# Patient Record
Sex: Female | Born: 1950 | Race: White | Hispanic: No | Smoking: Former smoker
Health system: Southern US, Community
[De-identification: ages and names within clinical notes are randomized; demographics above are authoritative.]

## PROBLEM LIST (undated history)

## (undated) DIAGNOSIS — I10 Essential (primary) hypertension: Secondary | ICD-10-CM

## (undated) DIAGNOSIS — E7800 Pure hypercholesterolemia, unspecified: Secondary | ICD-10-CM

## (undated) DIAGNOSIS — N182 Chronic kidney disease, stage 2 (mild): Secondary | ICD-10-CM

## (undated) DIAGNOSIS — G8929 Other chronic pain: Secondary | ICD-10-CM

## (undated) DIAGNOSIS — H9190 Unspecified hearing loss, unspecified ear: Secondary | ICD-10-CM

## (undated) DIAGNOSIS — M6281 Muscle weakness (generalized): Secondary | ICD-10-CM

## (undated) DIAGNOSIS — E119 Type 2 diabetes mellitus without complications: Secondary | ICD-10-CM

## (undated) DIAGNOSIS — G4700 Insomnia, unspecified: Secondary | ICD-10-CM

## (undated) DIAGNOSIS — F319 Bipolar disorder, unspecified: Secondary | ICD-10-CM

## (undated) DIAGNOSIS — E039 Hypothyroidism, unspecified: Secondary | ICD-10-CM

## (undated) DIAGNOSIS — R269 Unspecified abnormalities of gait and mobility: Secondary | ICD-10-CM

## (undated) DIAGNOSIS — H269 Unspecified cataract: Secondary | ICD-10-CM

## (undated) DIAGNOSIS — M810 Age-related osteoporosis without current pathological fracture: Secondary | ICD-10-CM

## (undated) DIAGNOSIS — I619 Nontraumatic intracerebral hemorrhage, unspecified: Secondary | ICD-10-CM

## (undated) HISTORY — DX: Type 2 diabetes mellitus without complications: E11.9

## (undated) HISTORY — DX: Pure hypercholesterolemia, unspecified: E78.00

## (undated) HISTORY — DX: Age-related osteoporosis without current pathological fracture: M81.0

## (undated) HISTORY — DX: Nontraumatic intracerebral hemorrhage, unspecified: I61.9

## (undated) HISTORY — DX: Hypothyroidism, unspecified: E03.9

## (undated) HISTORY — DX: Essential (primary) hypertension: I10

## (undated) HISTORY — DX: Unspecified abnormalities of gait and mobility: R26.9

## (undated) HISTORY — DX: Insomnia, unspecified: G47.00

## (undated) HISTORY — DX: Other chronic pain: G89.29

## (undated) HISTORY — DX: Unspecified cataract: H26.9

## (undated) HISTORY — DX: Muscle weakness (generalized): M62.81

## (undated) HISTORY — PX: CATARACT EXTRACTION: SUR2

## (undated) HISTORY — DX: Bipolar disorder, unspecified: F31.9

## (undated) HISTORY — DX: Chronic kidney disease, stage 2 (mild): N18.2

---

## 1997-12-26 ENCOUNTER — Other Ambulatory Visit: Admission: RE | Admit: 1997-12-26 | Discharge: 1997-12-26 | Payer: Self-pay | Admitting: Obstetrics and Gynecology

## 2001-02-25 ENCOUNTER — Other Ambulatory Visit: Admission: RE | Admit: 2001-02-25 | Discharge: 2001-02-25 | Payer: Self-pay | Admitting: Obstetrics and Gynecology

## 2002-03-25 ENCOUNTER — Other Ambulatory Visit: Admission: RE | Admit: 2002-03-25 | Discharge: 2002-03-25 | Payer: Self-pay | Admitting: Obstetrics and Gynecology

## 2002-04-19 ENCOUNTER — Encounter: Admission: RE | Admit: 2002-04-19 | Discharge: 2002-04-19 | Payer: Self-pay | Admitting: Urology

## 2002-04-19 ENCOUNTER — Encounter: Payer: Self-pay | Admitting: Urology

## 2002-12-26 ENCOUNTER — Other Ambulatory Visit: Admission: RE | Admit: 2002-12-26 | Discharge: 2002-12-26 | Payer: Self-pay | Admitting: Obstetrics and Gynecology

## 2004-01-23 ENCOUNTER — Other Ambulatory Visit: Admission: RE | Admit: 2004-01-23 | Discharge: 2004-01-23 | Payer: Self-pay | Admitting: Obstetrics and Gynecology

## 2004-06-10 ENCOUNTER — Ambulatory Visit (HOSPITAL_COMMUNITY): Admission: RE | Admit: 2004-06-10 | Discharge: 2004-06-10 | Payer: Self-pay | Admitting: Urology

## 2005-02-26 ENCOUNTER — Other Ambulatory Visit: Admission: RE | Admit: 2005-02-26 | Discharge: 2005-02-26 | Payer: Self-pay | Admitting: Obstetrics and Gynecology

## 2006-04-21 ENCOUNTER — Encounter: Admission: RE | Admit: 2006-04-21 | Discharge: 2006-04-21 | Payer: Self-pay | Admitting: Obstetrics and Gynecology

## 2007-11-04 ENCOUNTER — Ambulatory Visit (HOSPITAL_COMMUNITY): Admission: RE | Admit: 2007-11-04 | Discharge: 2007-11-04 | Payer: Self-pay | Admitting: Urology

## 2009-05-22 ENCOUNTER — Ambulatory Visit: Payer: Self-pay | Admitting: Internal Medicine

## 2009-07-02 ENCOUNTER — Ambulatory Visit: Payer: Self-pay | Admitting: Internal Medicine

## 2009-07-05 ENCOUNTER — Encounter: Payer: Self-pay | Admitting: Internal Medicine

## 2009-07-09 ENCOUNTER — Telehealth (INDEPENDENT_AMBULATORY_CARE_PROVIDER_SITE_OTHER): Payer: Self-pay | Admitting: *Deleted

## 2012-01-06 ENCOUNTER — Emergency Department (HOSPITAL_BASED_OUTPATIENT_CLINIC_OR_DEPARTMENT_OTHER)
Admission: EM | Admit: 2012-01-06 | Discharge: 2012-01-06 | Disposition: A | Discharge status: Home / Self Care | Payer: Federal, State, Local not specified - PPO | Attending: Emergency Medicine | Admitting: Emergency Medicine

## 2012-01-06 ENCOUNTER — Encounter (HOSPITAL_BASED_OUTPATIENT_CLINIC_OR_DEPARTMENT_OTHER): Payer: Self-pay | Admitting: *Deleted

## 2012-01-06 DIAGNOSIS — T169XXA Foreign body in ear, unspecified ear, initial encounter: Secondary | ICD-10-CM | POA: Insufficient documentation

## 2012-01-06 DIAGNOSIS — Z87891 Personal history of nicotine dependence: Secondary | ICD-10-CM | POA: Insufficient documentation

## 2012-01-06 DIAGNOSIS — IMO0002 Reserved for concepts with insufficient information to code with codable children: Secondary | ICD-10-CM | POA: Insufficient documentation

## 2012-01-06 HISTORY — DX: Unspecified hearing loss, unspecified ear: H91.90

## 2012-01-06 NOTE — ED Notes (Signed)
Pt c/o piece of hearing aid in left ear, sent here from [facility]

## 2012-01-06 NOTE — Discharge Instructions (Signed)
Ear Foreign Body  An ear foreign body is an object that is stuck in the ear. It is common for young children to put objects into the ear canal. These may include pebbles, beads, beans, and any other small objects which will fit. In adults, objects such as cotton swabs may become lodged in the ear canal. In all ages, the most common foreign bodies are insects that enter the ear canal.   SYMPTOMS   Foreign bodies may cause pain, buzzing or roaring sounds, hearing loss, and ear drainage.   HOME CARE INSTRUCTIONS    Keep all follow-up appointments with your caregiver as told.   Keep small objects out of reach of young children. Tell them not to put anything in their ears.  SEEK IMMEDIATE MEDICAL CARE IF:    You have bleeding from the ear.   You have increased pain or swelling of the ear.   You have reduced hearing.   You have discharge coming from the ear.   You have a fever.   You have a headache.  MAKE SURE YOU:    Understand these instructions.   Will watch your condition.   Will get help right away if you are not doing well or get worse.  Document Released: 07/18/2000 Document Revised: 07/10/2011 Document Reviewed: 03/08/2008  ExitCare Patient Information 2012 [employer].

## 2012-01-06 NOTE — ED Provider Notes (Signed)
History     CSN: [id number]  Arrival date & time 01/06/12  1419   First MD Initiated Contact with Patient 01/06/12 1441      Chief Complaint  Patient presents with  . Foreign Body in Ear    (Consider location/radiation/quality/duration/timing/severity/associated sxs/prior treatment) HPI Comments: Pt states that she has part of her hearing aid stuck in her left ear  Patient is a 61 y.o. female presenting with foreign body in ear. The history is provided by the patient. No language interpreter was used.  Foreign Body in Ear This is a new problem. The current episode started yesterday. The problem occurs constantly. The problem has been unchanged.    Past Medical History  Diagnosis Date  . HOH (hard of hearing)     History reviewed. No pertinent past surgical history.  History reviewed. No pertinent family history.  History  Substance Use Topics  . Smoking status: Former Games developer  . Smokeless tobacco: Not on file  . Alcohol Use: No    OB History    Grav Para Term Preterm Abortions TAB SAB Ect Mult Living                  Review of Systems  Constitutional: Negative.   Respiratory: Negative.   Cardiovascular: Negative.   Neurological: Negative.     Allergies  Propoxyphene hcl and Propoxyphene-acetaminophen  Home Medications  No current outpatient prescriptions on file.  BP 137/72  Pulse 71  Temp(Src) 98.8 F (37.1 C) (Oral)  Resp 18  Ht 5\' 4"  (1.626 m)  Wt 164 lb (74.39 kg)  BMI 28.15 kg/m2  SpO2 96%  Physical Exam  Vitals reviewed. Constitutional: She appears well-developed and well-nourished.  HENT:       Pt has a plastic ear piece in the left ear canal  Cardiovascular: Normal rate and regular rhythm.   Pulmonary/Chest: Effort normal and breath sounds normal.    ED Course  FOREIGN BODY REMOVAL Performed by: Teressa Lower Authorized by: Teressa Lower Consent: Verbal consent obtained. Written consent not obtained. Consent given by:  patient Patient identity confirmed: verbally with patient Time out: Immediately prior to procedure a "time out" was called to verify the correct patient, procedure, equipment, support staff and site/side marked as required. Body area: ear Location details: left ear Localization method: visualized Removal mechanism: forceps 1 objects recovered. Objects recovered: part of hearing aid Post-procedure assessment: foreign body removed Patient tolerance: Patient tolerated the procedure well with no immediate complications.   (including critical care time)  Labs Reviewed - No data to display No results found.   1. FB ear       MDM  fb removed:tm intact        Teressa Lower, NP 01/06/12 1459

## 2012-01-12 NOTE — ED Provider Notes (Signed)
Medical screening examination/treatment/procedure(s) were performed by non-physician practitioner and as supervising physician I was immediately available for consultation/collaboration.    Caren Garske L Rozlyn Yerby, MD 01/12/12 1133 

## 2014-11-20 ENCOUNTER — Other Ambulatory Visit: Payer: Self-pay | Admitting: Obstetrics and Gynecology

## 2014-11-21 LAB — CYTOLOGY - PAP

## 2015-03-08 ENCOUNTER — Encounter: Payer: Self-pay | Admitting: Internal Medicine

## 2016-12-22 ENCOUNTER — Other Ambulatory Visit: Payer: Self-pay | Admitting: Obstetrics and Gynecology

## 2016-12-22 DIAGNOSIS — R928 Other abnormal and inconclusive findings on diagnostic imaging of breast: Secondary | ICD-10-CM

## 2016-12-24 ENCOUNTER — Other Ambulatory Visit: Payer: Self-pay | Admitting: Obstetrics and Gynecology

## 2016-12-24 ENCOUNTER — Ambulatory Visit
Admission: RE | Admit: 2016-12-24 | Discharge: 2016-12-24 | Disposition: A | Discharge status: Home / Self Care | Payer: Federal, State, Local not specified - PPO | Source: Ambulatory Visit | Attending: Obstetrics and Gynecology | Admitting: Obstetrics and Gynecology

## 2016-12-24 DIAGNOSIS — R928 Other abnormal and inconclusive findings on diagnostic imaging of breast: Secondary | ICD-10-CM

## 2018-02-01 IMAGING — MG DIGITAL DIAGNOSTIC UNILATERAL RIGHT MAMMOGRAM
3 series · 3 of 3 positions shown · non-contrast
Comparison: 12/17/2016 and earlier

CLINICAL DATA: Patient returns after screening study for evaluation
of right breast calcifications.

EXAM:
DIGITAL DIAGNOSTIC RIGHT MAMMOGRAM WITH CAD

[R ML (1 of 2)]
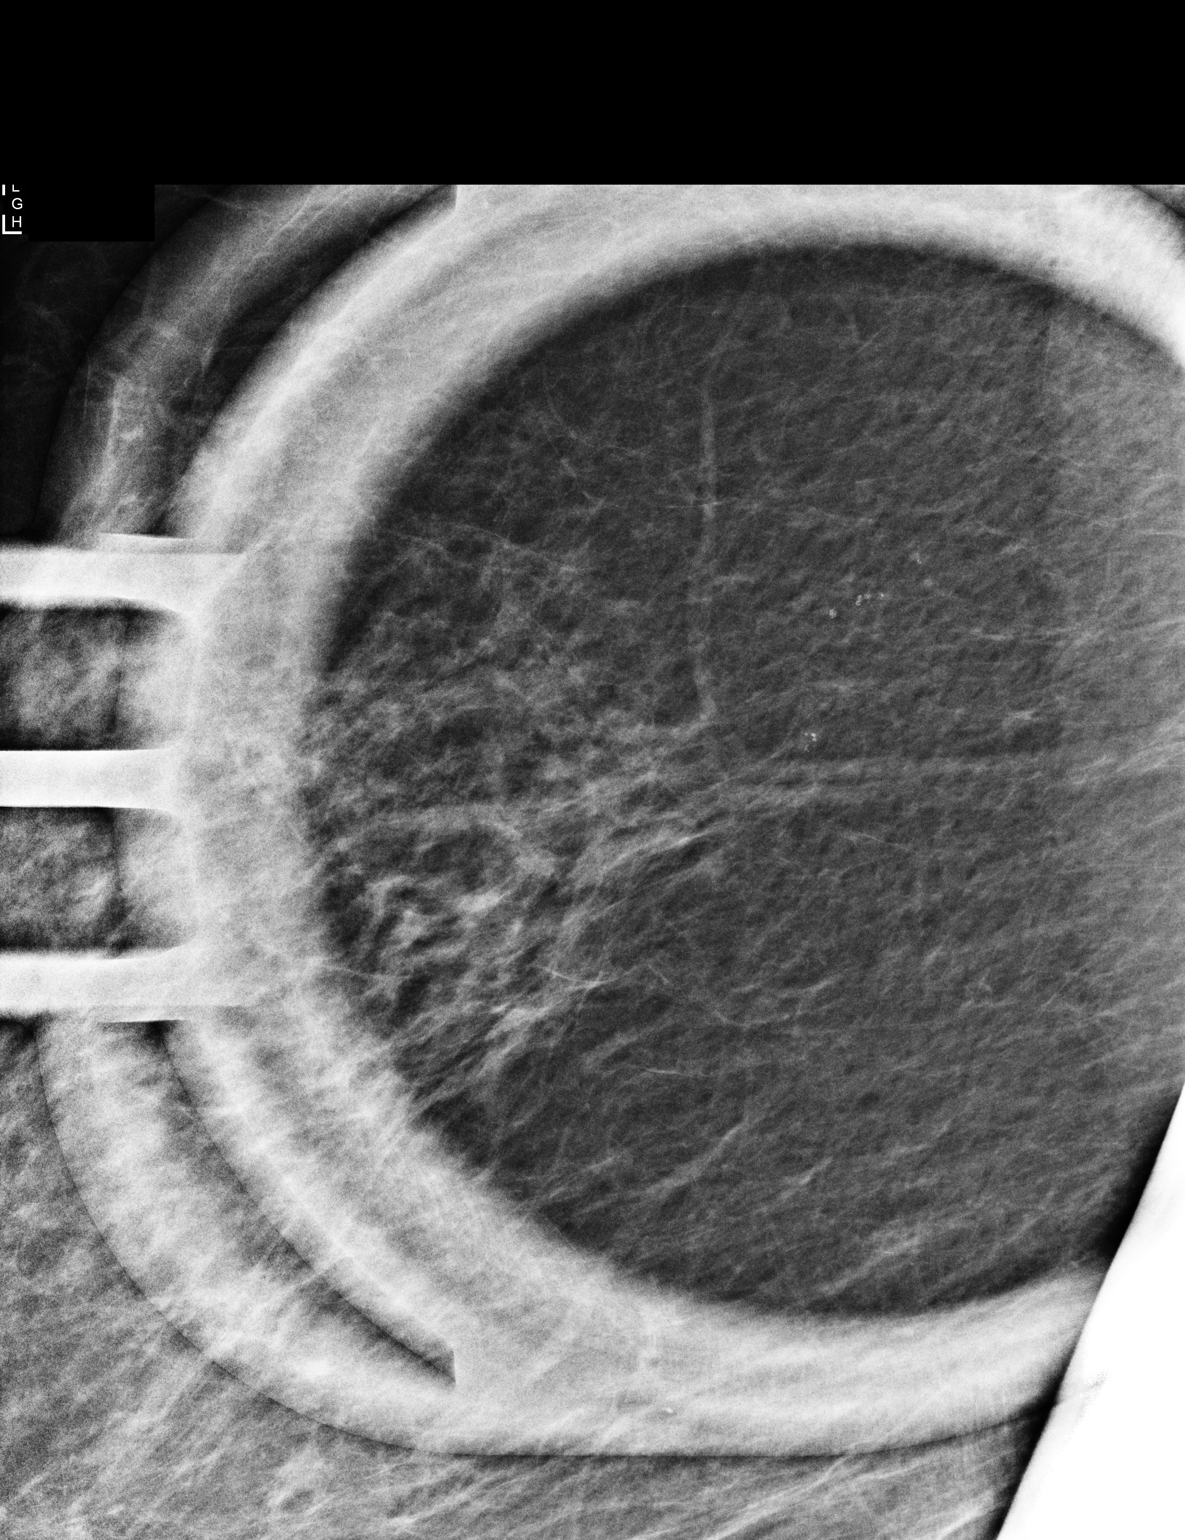

[R ML (2 of 2)]
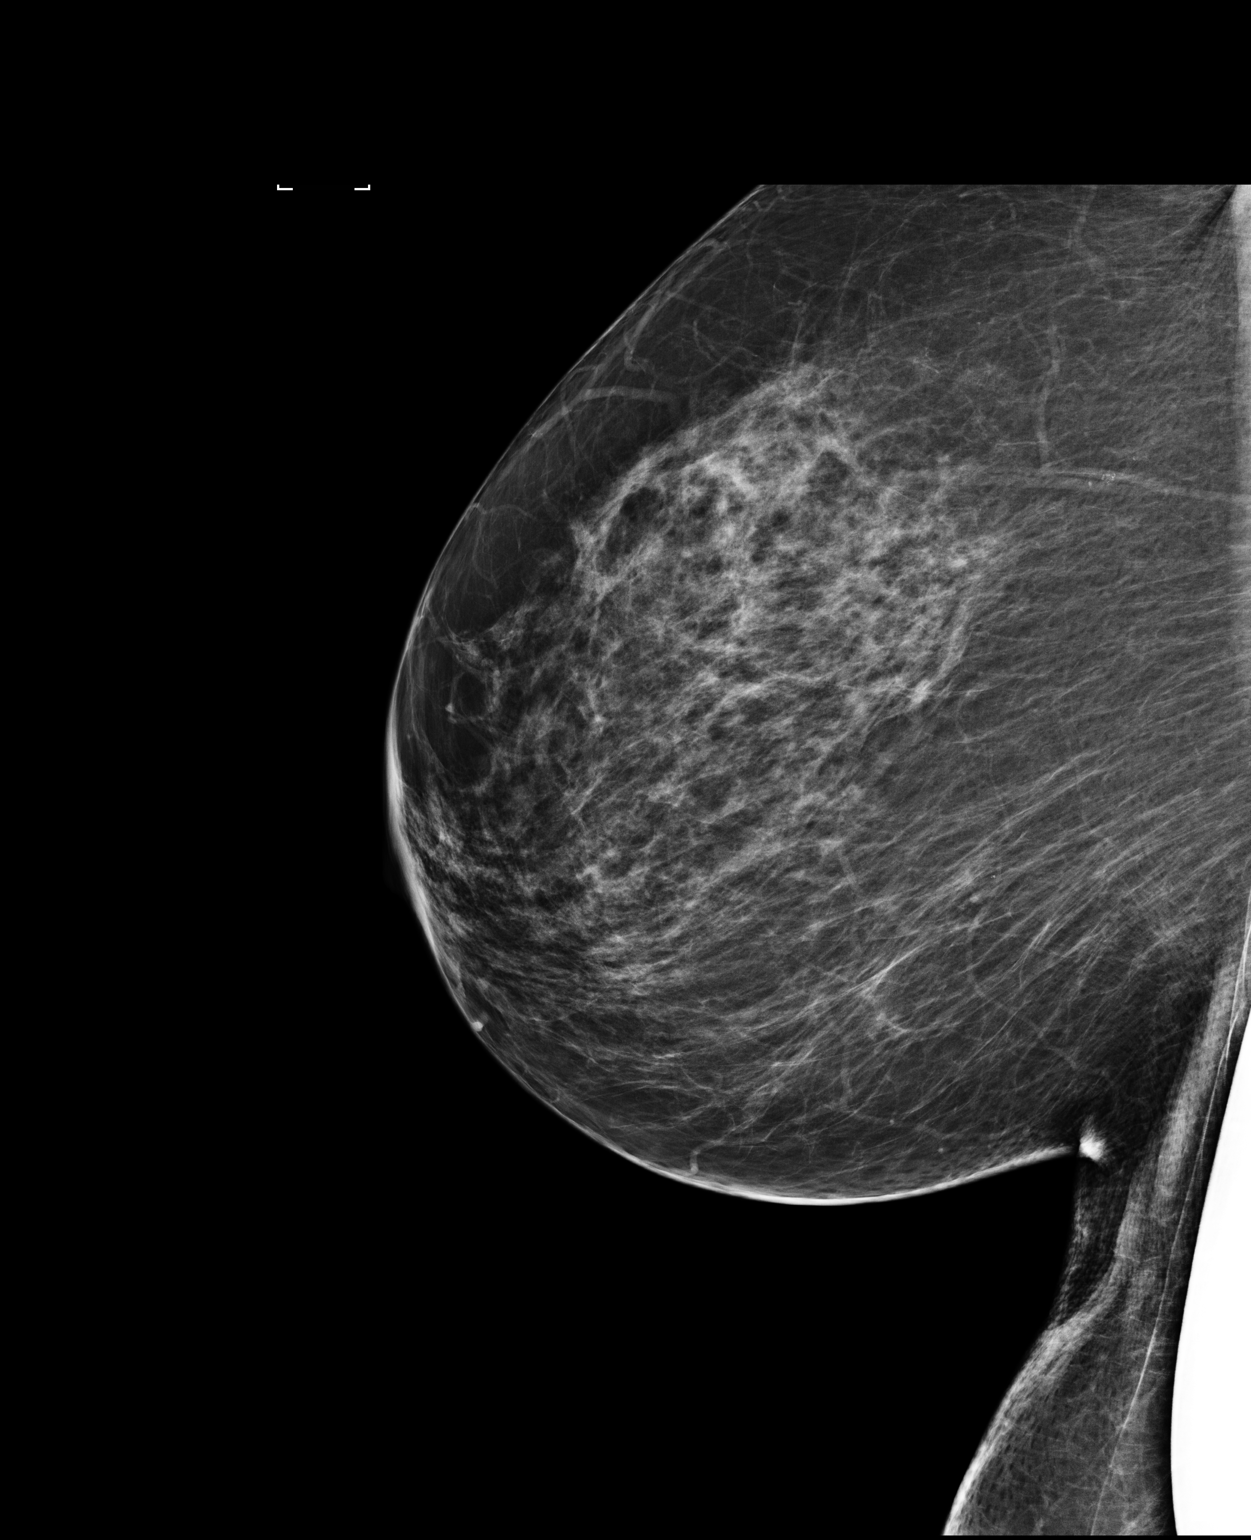

[R CC]
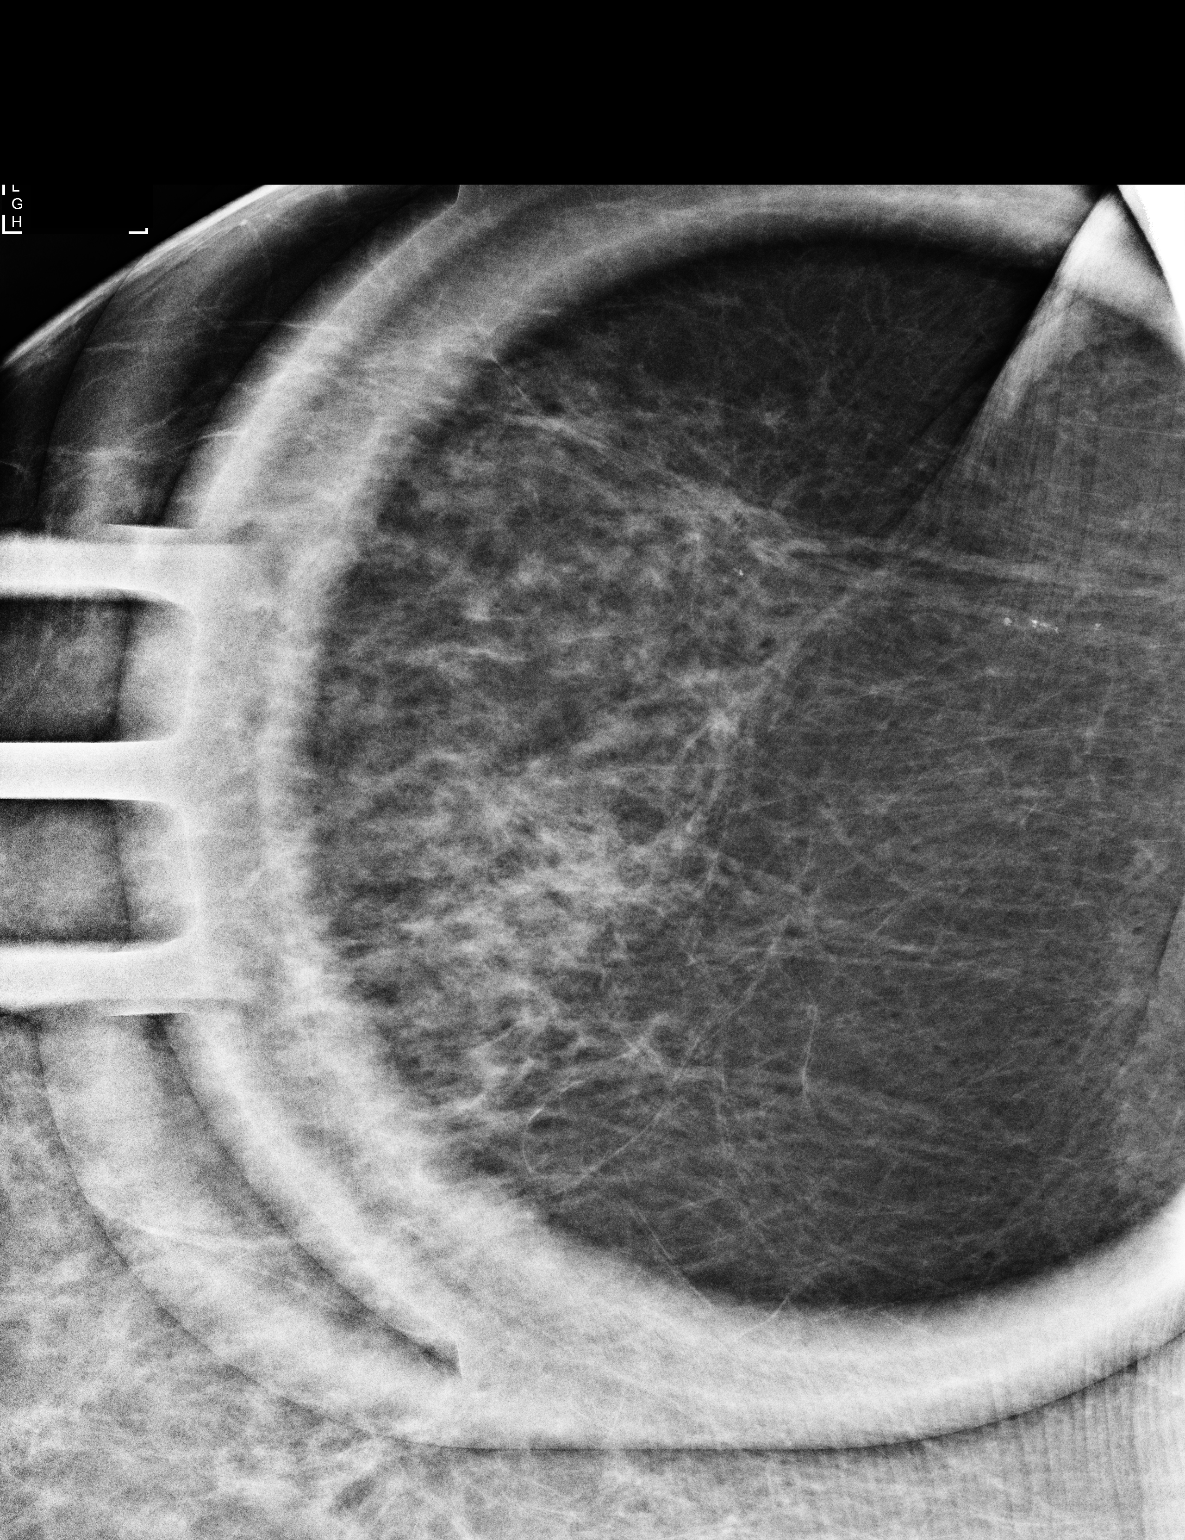

[3 of 3 positions shown; findings below may reference images not displayed]

ACR Breast Density Category b: There are scattered areas of
fibroglandular density.
FINDINGS: Magnified views are performed of calcifications in the upper-outer
quadrant of the right breast. These views demonstrate a 7 mm group
of primarily rounded calcifications with a linear distribution. No
associated mass or distortion.

Mammographic images were processed with CAD.
IMPRESSION: Indeterminate right breast calcifications for which biopsy is
indicated.

RECOMMENDATION:
Stereotactic guided core biopsy is recommended and scheduled for the
patient.

I have discussed the findings and recommendations with the patient.
Results were also provided in writing at the conclusion of the
visit. If applicable, a reminder letter will be sent to the patient
regarding the next appointment.

BI-RADS CATEGORY  4: Suspicious.

## 2019-05-20 ENCOUNTER — Encounter: Payer: Self-pay | Admitting: Gastroenterology

## 2022-01-16 ENCOUNTER — Encounter: Payer: Self-pay | Admitting: Diagnostic Neuroimaging

## 2022-01-16 ENCOUNTER — Ambulatory Visit (INDEPENDENT_AMBULATORY_CARE_PROVIDER_SITE_OTHER): Payer: Medicare Other | Admitting: Diagnostic Neuroimaging

## 2022-01-16 VITALS — BP 118/71 | HR 63 | Ht 63.5 in | Wt 108.0 lb

## 2022-01-16 DIAGNOSIS — I618 Other nontraumatic intracerebral hemorrhage: Secondary | ICD-10-CM

## 2022-01-16 DIAGNOSIS — R413 Other amnesia: Secondary | ICD-10-CM | POA: Diagnosis not present

## 2022-01-16 NOTE — Patient Instructions (Addendum)
LEFT PARIETAL INTRACEREBRAL HEMORRHAGE - unclear etiology: ? 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

## 2022-01-16 NOTE — Progress Notes (Signed)
[facility]  PATIENT: Bridget SCHWEPPE DOB: [birth year]  REFERRING CLINICIAN: No ref. provider found HISTORY FROM: patient  REASON FOR VISIT: new consult   HISTORICAL  CHIEF COMPLAINT:  Chief Complaint  Patient presents with   Intracranial hemorrhage    Rm 6 Int Ref, [facility] sister- Joy, bro-in-law -Gala Romney, resides at [facility] [place]    HISTORY OF PRESENT ILLNESS:   71 year old female here for evaluation of left parietal intracerebral hemorrhage.  History of bipolar 2 disorder, diabetes, hyperlipidemia, hypertension.  November 2022 patient was in normal state of health.  She saw family and was doing well.  January 2023 patient had onset of low back pain, leg pain, gait and balance difficulty.  She went to the hospital and was diagnosed with lumbar strain.  Patient continued to decline.  She admitted to the hospital and had additional testing including MRI of the thoracic and lumbar spine which were unremarkable.  Ultimately she was diagnosed with physical deconditioning.  She went through some inpatient rehab and then went home.  In May 2023 she had rapid cognitive and physical decline.  She was seen in clinic and then referred to the emergency room.  She was found to have a large left parietal intracerebral hemorrhage.  She also had hypertensive emergency.  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  Also had UTI and leukocytosis.  Patient was stabilized and transferred to assisted living.  Patient is improving but still has some memory problems and generalized weakness.  Of note prior to 2023 patient was having some memory and cognitive decline.  She was having abnormal [employer] purchase patterns.  She was having harder time managing her medications.  She will was living alone and is somewhat introvert for the most part.   REVIEW OF SYSTEMS: Full 14 system review of systems performed and negative  with exception of: as per HPI.  ALLERGIES: Allergies  Allergen Reactions   Amlodipine     Other reaction(s): Other (See Comments) Lower extremity swelling Lower extremity swelling    Codeine     Other reaction(s): Other (See Comments) Mental  Makes patient irritable Makes patient irritable    Lithium     Other reaction(s): Other (See Comments) Severe muscle weakness Severe muscle weakness    Propoxyphene Hcl    Propoxyphene N-Acetaminophen Nausea Only    HOME MEDICATIONS: Outpatient Medications Prior to Visit  Medication Sig Dispense Refill   acetaminophen (TYLENOL) 500 MG tablet Take by mouth.     Ascorbic Acid (VITAMIN C) 1000 MG tablet Take by mouth daily.     Coenzyme Q10 100 MG capsule Take by mouth daily.     cyclobenzaprine (FLEXERIL) 5 MG tablet Take 5 mg by mouth 3 (three) times daily as needed.     Docusate Sodium (DSS) 100 MG CAPS Take by mouth.     gabapentin (NEURONTIN) 300 MG capsule Take 300 mg by mouth 3 (three) times daily.     lisinopril (ZESTRIL) 10 MG tablet Take 20 mg by mouth daily.     magnesium oxide (MAG-OX) 400 MG tablet Take by mouth daily.     metFORMIN (GLUCOPHAGE) 500 MG tablet Take 500 mg by mouth 2 (two) times daily.     metoprolol tartrate (LOPRESSOR) 50 MG tablet Take 50 mg by mouth 2 (two) times daily.     Oyster Shell (OYSTER CALCIUM) 500 MG TABS tablet Take 500 mg of elemental calcium by mouth daily.  potassium chloride SA (KLOR-CON M) 20 MEQ tablet Take 20 mEq by mouth daily.     No facility-administered medications prior to visit.    PAST MEDICAL HISTORY: Past Medical History:  Diagnosis Date   Abnormal gait    Bipolar 1 disorder (HCC)    Cataracts, bilateral    Chronic pain    CKD (chronic kidney disease), stage II    Diabetes mellitus without complication (HCC)    HOH (hard of hearing)    Hypercholesterolemia    Hypertension    Hypothyroid    ICH (intracerebral hemorrhage) (HCC)    Insomnia    Muscle weakness     Osteoporosis     PAST SURGICAL HISTORY: Past Surgical History:  Procedure Laterality Date   CATARACT EXTRACTION      FAMILY HISTORY: Family History  Problem Relation Age of Onset   Breast cancer Sister     SOCIAL HISTORY: Social History   Socioeconomic History   Marital status: Divorced    Spouse name: Not on file   Number of children: Not on file   Years of education: Not on file   Highest education level: Not on file  Occupational History   Not on file  Tobacco Use   Smoking status: Former   Smokeless tobacco: Not on file  Substance and Sexual Activity   Alcohol use: No   Drug use: No   Sexual activity: Never  Other Topics Concern   Not on file  Social History Narrative   01/16/22 residing at [facility] [place]   Social Determinants of Health   Financial Resource Strain: Not on file  Food Insecurity: Not on file  Transportation Needs: Not on file  Physical Activity: Not on file  Stress: Not on file  Social Connections: Not on file  Intimate Partner Violence: Not on file     PHYSICAL EXAM  GENERAL EXAM/CONSTITUTIONAL: Vitals:  Vitals:   01/16/22 1515  BP: 118/71  Pulse: 63  Weight: 108 lb (49 kg)  Height: 5' 3.5" (1.613 m)   Body mass index is 18.83 kg/m. Wt Readings from Last 3 Encounters:  01/16/22 108 lb (49 kg)  01/06/12 164 lb (74.4 kg)   Patient is in no distress; well developed, nourished and groomed; neck is supple  CARDIOVASCULAR: Examination of carotid arteries is normal; no carotid bruits Regular rate and rhythm, no murmurs Examination of peripheral vascular system by observation and palpation is normal  EYES: Ophthalmoscopic exam of optic discs and posterior segments is normal; no papilledema or hemorrhages No results found.  MUSCULOSKELETAL: Gait, strength, tone, movements noted in Neurologic exam below  NEUROLOGIC: MENTAL STATUS:     01/16/2022    4:26 PM  MMSE - Mini Mental State Exam  Orientation to time 2   Orientation to Place 2  Registration 2  Attention/ Calculation 0  Recall 2  Language- name 2 objects 2  Language- repeat 1  Language- follow 3 step command 3  Language- read & follow direction 1  Write a sentence 1  Copy design 0  Total score 16   awake, alert, oriented to person, place and time recent and remote memory intact normal attention and concentration language fluent, comprehension intact, naming intact fund of knowledge appropriate  CRANIAL NERVE:  2nd - no papilledema on fundoscopic exam 2nd, 3rd, 4th, 6th - pupils equal and reactive to light, visual fields full to confrontation, extraocular muscles intact, no nystagmus 5th - facial sensation symmetric 7th - facial strength symmetric 8th -  hearing intact 9th - palate elevates symmetrically, uvula midline 11th - shoulder shrug symmetric 12th - tongue protrusion midline Slightly slow speech  MOTOR:  normal bulk and tone; BUE 4+; BLE 4  SENSORY:  normal and symmetric to light touch, temperature, vibration  COORDINATION:  finger-nose-finger, fine finger movements normal  REFLEXES:  deep tendon reflexes TRACE and symmetric  GAIT/STATION:  IN WHEEL CHAIR     DIAGNOSTIC DATA (LABS, IMAGING, TESTING) - I reviewed patient records, labs, notes, testing and imaging myself where available.  No results found for: "WBC", "HGB", "HCT", "MCV", "PLT" No results found for: "NA", "K", "CL", "CO2", "GLUCOSE", "BUN", "CREATININE", "CALCIUM", "PROT", "ALBUMIN", "AST", "ALT", "ALKPHOS", "BILITOT", "GFRNONAA", "GFRAA" No results found for: "CHOL", "HDL", "LDLCALC", "LDLDIRECT", "TRIG", "CHOLHDL" No results found for: "HGBA1C" No results found for: "VITAMINB12" No results found for: "TSH"  08/26/21 MRI thoracic spine 1.  Mild thoracic spondylosis. No significant disc herniation or significant spinal canal/foraminal stenosis. No spinal cord or definite nerve root impingement.  2.  There is an old appearing T11  compression fracture.  3.  No acute fracture.  4.  No intrinsic spinal cord abnormality.   08/26/21 MRI lumbar spine 1.  There is a recent appearing mild inferior endplate compression fracture at L4. Slight retropulsion resulting in mild bony encroachment of the spinal canal.  2.  There is an old severe compression deformity at T11 without significant retropulsion.  3.  Relatively mild multilevel degenerative changes lumbar spine. No focal disc herniation or definite nerve root impingement.   12/08/21 CT head  Large 5.5 x 3.5 x 3 cm left parietal occipital lobe intraparenchymal hemorrhage with prominent surrounding edema.  Mild 4 mm of left to right midline shift with effacement of the temporal horn of left lateral ventricle.  Chronic left maxillary sinus disease.   12/09/21 MRI brain Large left parieto-occipital hemorrhage with underlying mass. 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 Other hemorrhagic mass not excluded but less likely.   There is some hyperintensity in the pons. 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    12/09/21 CT head / neck / chest 1.  CTA neck demonstrates aortic dissection involving the posterior aortic arch and visualized descending aorta. Dedicated CTA chest recommended.  2.  Tortuous extracranial internal carotid arteries with mild DDD and appearance of the mid and distal cervical segments suggesting possible fibromuscular dysplasia (FMD).  3.  No significant vertebral artery stenosis.  4.  Intracranially, there is no large vessel occlusion or cervical stenosis. Persistent trigeminal artery on the left. Dominant left A1 segment with a very hypoplastic, but patent right A1 segment. Dysplastic appearance of the anterior communicating artery. Tiny anterior communicating artery aneurysm difficult to exclude. No convincing aneurysm or vascular malformation appreciated in the area of the parenchymal hemorrhage left parietal lobe.  5.  No convincing venous  thrombosis appreciated.      ASSESSMENT AND PLAN  71 y.o. year old female here with:   Dx:  1. Memory loss   2. Other left-sided nontraumatic intracerebral hemorrhage (HCC)       PLAN:  LEFT PARIETAL INTRACEREBRAL HEMORRHAGE - unclear etiology: ? 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  I spent 90 minutes of face-to-face and non-face-to-face time with patient.  This included outside chart review, lab review, study review, order entry, electronic health record documentation, patient education.     Suanne Marker, MD 01/16/2022, 4:11 PM Certified in Neurology, Neurophysiology and Neuroimaging  [facility] [address], [address] [place], [place] [address] [phone]

## 2022-01-21 ENCOUNTER — Telehealth: Payer: Self-pay | Admitting: Diagnostic Neuroimaging

## 2022-01-21 NOTE — Telephone Encounter (Signed)
medicare/[employer] federal [employer] sent to [facility]

## 2022-01-23 LAB — CBC WITH DIFFERENTIAL/PLATELET
Basophils Absolute: 0 10*3/uL (ref 0.0–0.2)
Basos: 0 %
EOS (ABSOLUTE): 0.1 10*3/uL (ref 0.0–0.4)
Eos: 1 %
Hematocrit: 35.7 % (ref 34.0–46.6)
Hemoglobin: 12.1 g/dL (ref 11.1–15.9)
Immature Grans (Abs): 0.1 10*3/uL (ref 0.0–0.1)
Immature Granulocytes: 1 %
Lymphocytes Absolute: 3.5 10*3/uL — ABNORMAL HIGH (ref 0.7–3.1)
Lymphs: 29 %
MCH: 29 pg (ref 26.6–33.0)
MCHC: 33.9 g/dL (ref 31.5–35.7)
MCV: 86 fL (ref 79–97)
Monocytes Absolute: 1.1 10*3/uL — ABNORMAL HIGH (ref 0.1–0.9)
Monocytes: 9 %
Neutrophils Absolute: 7.1 10*3/uL — ABNORMAL HIGH (ref 1.4–7.0)
Neutrophils: 60 %
Platelets: 342 10*3/uL (ref 150–450)
RBC: 4.17 x10E6/uL (ref 3.77–5.28)
RDW: 12.5 % (ref 11.7–15.4)
WBC: 12 10*3/uL — ABNORMAL HIGH (ref 3.4–10.8)

## 2022-01-23 LAB — COMPREHENSIVE METABOLIC PANEL
ALT: 12 IU/L (ref 0–32)
AST: 13 IU/L (ref 0–40)
Albumin/Globulin Ratio: 1.4 (ref 1.2–2.2)
Albumin: 3.9 g/dL (ref 3.8–4.8)
Alkaline Phosphatase: 101 IU/L (ref 44–121)
BUN/Creatinine Ratio: 11 — ABNORMAL LOW (ref 12–28)
BUN: 8 mg/dL (ref 8–27)
Bilirubin Total: <0.2 mg/dL (ref 0.0–1.2)
CO2: 23 mmol/L (ref 20–29)
Calcium: 10.1 mg/dL (ref 8.7–10.3)
Chloride: 102 mmol/L (ref 96–106)
Creatinine, Ser: 0.72 mg/dL (ref 0.57–1.00)
Globulin, Total: 2.7 g/dL (ref 1.5–4.5)
Glucose: 105 mg/dL — ABNORMAL HIGH (ref 70–99)
Potassium: 4.8 mmol/L (ref 3.5–5.2)
Sodium: 140 mmol/L (ref 134–144)
Total Protein: 6.6 g/dL (ref 6.0–8.5)
eGFR: 90 mL/min/{1.73_m2} (ref >59)

## 2022-01-23 LAB — ACHR ABS WITH REFLEX TO MUSK: AChR Binding Ab, Serum: 0.04 nmol/L (ref 0.00–0.24)

## 2022-01-23 LAB — VITAMIN B1: Thiamine: 93.1 nmol/L (ref 66.5–200.0)

## 2022-01-23 LAB — CK: Total CK: 19 U/L — ABNORMAL LOW (ref 32–182)

## 2022-01-23 LAB — MUSK ANTIBODIES: MuSK Antibodies: <1 U/mL

## 2022-01-23 LAB — VITAMIN B12: Vitamin B-12: 614 pg/mL (ref 232–1245)

## 2022-01-23 LAB — TSH: TSH: 1.44 u[IU]/mL (ref 0.450–4.500)

## 2022-01-24 ENCOUNTER — Telehealth: Payer: Self-pay

## 2022-01-24 NOTE — Telephone Encounter (Signed)
Mychart message sent to the pt on lab testing.

## 2022-02-03 ENCOUNTER — Encounter: Payer: Self-pay | Admitting: Diagnostic Neuroimaging

## 2022-02-05 NOTE — Telephone Encounter (Signed)
MRI shows that her cerebral hemorrhage has improved since her last scan on May 8. The swelling around the bleed has almost completely resolved.

## 2024-08-04 DEATH — deceased
